# Patient Record
Sex: Female | Born: 1960 | Race: Black or African American | Hispanic: No | Marital: Single | Smoking: Never smoker
Health system: Southern US, Community
[De-identification: ages and names within clinical notes are randomized; demographics above are authoritative.]

## PROBLEM LIST (undated history)

## (undated) DIAGNOSIS — I10 Essential (primary) hypertension: Secondary | ICD-10-CM

## (undated) HISTORY — PX: PARTIAL HYSTERECTOMY: SHX80

---

## 2001-06-23 ENCOUNTER — Encounter: Payer: Self-pay | Admitting: Family Medicine

## 2001-06-23 ENCOUNTER — Ambulatory Visit (HOSPITAL_COMMUNITY): Admission: RE | Admit: 2001-06-23 | Discharge: 2001-06-23 | Payer: Self-pay | Admitting: Family Medicine

## 2002-07-13 ENCOUNTER — Ambulatory Visit (HOSPITAL_COMMUNITY): Admission: RE | Admit: 2002-07-13 | Discharge: 2002-07-13 | Payer: Self-pay | Admitting: Family Medicine

## 2004-08-31 ENCOUNTER — Ambulatory Visit (HOSPITAL_COMMUNITY): Admission: RE | Admit: 2004-08-31 | Discharge: 2004-08-31 | Payer: Self-pay | Admitting: Family Medicine

## 2005-10-29 ENCOUNTER — Ambulatory Visit (HOSPITAL_COMMUNITY): Admission: RE | Admit: 2005-10-29 | Discharge: 2005-10-29 | Payer: Self-pay | Admitting: Family Medicine

## 2006-07-25 ENCOUNTER — Ambulatory Visit (HOSPITAL_COMMUNITY): Admission: RE | Admit: 2006-07-25 | Discharge: 2006-07-25 | Payer: Self-pay | Admitting: Obstetrics and Gynecology

## 2006-11-03 ENCOUNTER — Ambulatory Visit (HOSPITAL_COMMUNITY): Admission: RE | Admit: 2006-11-03 | Discharge: 2006-11-03 | Payer: Self-pay | Admitting: Family Medicine

## 2008-04-27 ENCOUNTER — Ambulatory Visit (HOSPITAL_COMMUNITY): Admission: RE | Admit: 2008-04-27 | Discharge: 2008-04-27 | Payer: Self-pay | Admitting: Family Medicine

## 2009-09-12 ENCOUNTER — Inpatient Hospital Stay (HOSPITAL_COMMUNITY): Admission: RE | Admit: 2009-09-12 | Discharge: 2009-09-14 | Payer: Self-pay | Admitting: Obstetrics and Gynecology

## 2009-09-12 ENCOUNTER — Encounter: Payer: Self-pay | Admitting: Obstetrics and Gynecology

## 2010-06-10 LAB — COMPREHENSIVE METABOLIC PANEL
ALT: 21 U/L (ref 0–35)
BUN: 7 mg/dL (ref 6–23)
CO2: 31 mEq/L (ref 19–32)
Calcium: 9.1 mg/dL (ref 8.4–10.5)
Creatinine, Ser: 0.63 mg/dL (ref 0.4–1.2)
GFR calc non Af Amer: >60 mL/min (ref >60)
Glucose, Bld: 83 mg/dL (ref 70–99)
Sodium: 138 mEq/L (ref 135–145)

## 2010-06-10 LAB — CBC
HCT: 39.4 % (ref 36.0–46.0)
Hemoglobin: 11.5 g/dL — ABNORMAL LOW (ref 12.0–15.0)
Hemoglobin: 13.1 g/dL (ref 12.0–15.0)
MCHC: 33.3 g/dL (ref 30.0–36.0)
MCHC: 33.5 g/dL (ref 30.0–36.0)
MCV: 82.5 fL (ref 78.0–100.0)
RBC: 4.78 MIL/uL (ref 3.87–5.11)
RDW: 14.4 % (ref 11.5–15.5)

## 2010-06-10 LAB — HCG, SERUM, QUALITATIVE: Preg, Serum: NEGATIVE

## 2010-06-10 LAB — TYPE AND SCREEN

## 2011-07-18 ENCOUNTER — Ambulatory Visit (INDEPENDENT_AMBULATORY_CARE_PROVIDER_SITE_OTHER): Payer: BC Managed Care – PPO | Admitting: Otolaryngology

## 2011-07-18 DIAGNOSIS — H903 Sensorineural hearing loss, bilateral: Secondary | ICD-10-CM

## 2011-07-18 DIAGNOSIS — H6120 Impacted cerumen, unspecified ear: Secondary | ICD-10-CM

## 2011-07-18 DIAGNOSIS — H6980 Other specified disorders of Eustachian tube, unspecified ear: Secondary | ICD-10-CM

## 2011-07-18 DIAGNOSIS — H9319 Tinnitus, unspecified ear: Secondary | ICD-10-CM

## 2012-11-02 ENCOUNTER — Other Ambulatory Visit (HOSPITAL_COMMUNITY): Payer: Self-pay | Admitting: Physician Assistant

## 2012-11-02 DIAGNOSIS — Z139 Encounter for screening, unspecified: Secondary | ICD-10-CM

## 2012-11-05 ENCOUNTER — Ambulatory Visit (HOSPITAL_COMMUNITY)
Admission: RE | Admit: 2012-11-05 | Discharge: 2012-11-05 | Disposition: A | Discharge status: Home / Self Care | Payer: BC Managed Care – PPO | Source: Ambulatory Visit | Attending: Physician Assistant | Admitting: Physician Assistant

## 2012-11-05 DIAGNOSIS — Z139 Encounter for screening, unspecified: Secondary | ICD-10-CM

## 2012-11-05 DIAGNOSIS — Z1231 Encounter for screening mammogram for malignant neoplasm of breast: Secondary | ICD-10-CM | POA: Insufficient documentation

## 2014-02-02 ENCOUNTER — Other Ambulatory Visit (HOSPITAL_COMMUNITY): Payer: Self-pay | Admitting: Physician Assistant

## 2014-02-02 DIAGNOSIS — Z139 Encounter for screening, unspecified: Secondary | ICD-10-CM

## 2014-02-07 ENCOUNTER — Ambulatory Visit (HOSPITAL_COMMUNITY)
Admission: RE | Admit: 2014-02-07 | Discharge: 2014-02-07 | Disposition: A | Discharge status: Home / Self Care | Payer: BC Managed Care – PPO | Source: Ambulatory Visit | Attending: Physician Assistant | Admitting: Physician Assistant

## 2014-02-07 DIAGNOSIS — Z139 Encounter for screening, unspecified: Secondary | ICD-10-CM

## 2014-02-07 DIAGNOSIS — Z1231 Encounter for screening mammogram for malignant neoplasm of breast: Secondary | ICD-10-CM | POA: Insufficient documentation

## 2016-07-15 ENCOUNTER — Encounter: Payer: Self-pay | Admitting: Obstetrics and Gynecology

## 2016-07-15 ENCOUNTER — Other Ambulatory Visit (HOSPITAL_COMMUNITY)
Admission: RE | Admit: 2016-07-15 | Discharge: 2016-07-15 | Disposition: A | Discharge status: Home / Self Care | Payer: BC Managed Care – PPO | Source: Ambulatory Visit | Attending: Obstetrics and Gynecology | Admitting: Obstetrics and Gynecology

## 2016-07-15 ENCOUNTER — Ambulatory Visit (INDEPENDENT_AMBULATORY_CARE_PROVIDER_SITE_OTHER): Payer: BC Managed Care – PPO | Admitting: Obstetrics and Gynecology

## 2016-07-15 VITALS — BP 102/60 | HR 76 | Ht 59.0 in | Wt 127.6 lb

## 2016-07-15 DIAGNOSIS — Z124 Encounter for screening for malignant neoplasm of cervix: Secondary | ICD-10-CM | POA: Diagnosis present

## 2016-07-15 DIAGNOSIS — Z01419 Encounter for gynecological examination (general) (routine) without abnormal findings: Secondary | ICD-10-CM

## 2016-07-15 NOTE — Progress Notes (Signed)
Patient ID: Phyllis Swanson, female   DOB: 15-Mar-1961, 56 y.o.   MRN: [id number]  Assessment:  Annual Gyn Exam Vasomotor symptoms    Plan:  1. pap smear done, next pap due in 3 years  2. return annually or prn 3    Annual mammogram and self exams advised 4   Regular colonoscopies advised  5   Rx vivelle dot   Subjective:   Chief Complaint  Patient presents with  . Gynecologic Exam    pap only     Phyllis Swanson is a 56 y.o. female No obstetric history on file. who presents for annual exam. No LMP recorded. Patient has had a hysterectomy. The patient has no complaints today. She reports occasional hot sweats. Per pt, she has previously tried estrogen patches.   Discussion: 1. Discussed with pt current information, studies and literature available on risks and benefits of hormone therapy, specifically in regards to estrogen.   At end of discussion, pt had opportunity to ask questions and has no further questions at this time.   Specific discussion of hormone therapy as noted above. Greater than 50% was spent in counseling and coordination of care with the patient.   Total time greater than: 15 minutes.     The following portions of the patient's history were reviewed and updated as appropriate: allergies, current medications, past family history, past medical history, past social history, past surgical history and problem list. History reviewed. No pertinent past medical history.  Past Surgical History:  Procedure Laterality Date  . PARTIAL HYSTERECTOMY      No current outpatient prescriptions on file.  Review of Systems Constitutional: negative Gastrointestinal: negative Genitourinary: negative   Objective:  BP 102/60   Pulse 76   Ht 4\' 11"  (1.499 m)   Wt 127 lb 9.6 oz (57.9 kg)   BMI 25.77 kg/m    BMI: Body mass index is 25.77 kg/m.  General Appearance: Alert, appropriate appearance for age. No acute distress HEENT: Grossly normal Neck / Thyroid:   Cardiovascular: RRR; normal S1, S2, no murmur Lungs: CTA bilaterally Back: No CVAT Breast Exam: deferred  Gastrointestinal: Soft, non-tender, no masses or organomegaly Pelvic Exam:  External genitalia: normal general appearance Vaginal: normal mucosa without prolapse or lesions Cervix: normal appearance, standard pap obtained  Adnexa/bimanual: normal exam  Uterus surgically removed  Rectovaginal: Normal rectal; no masses, guaiac negative stool obtained.  Lymphatic Exam: Non-palpable nodes in neck, clavicular, axillary, or inguinal regions  Skin: no rash or abnormalities Neurologic: Normal gait and speech, no tremor  Psychiatric: Alert and oriented, appropriate affect.  Urinalysis:Not done  Guaiac negative.   Mallory Shirk. MD Pgr [phone] 4:25 PM   By signing my name below, I, Hansel Feinstein, attest that this documentation has been prepared under the direction and in the presence of Jonnie Kind, MD. Electronically Signed: Hansel Feinstein, ED Scribe. 07/15/16. 4:25 PM.  I personally performed the services described in this documentation, which was SCRIBED in my presence. The recorded information has been reviewed and considered accurate. It has been edited as necessary during review. Jonnie Kind, MD

## 2016-07-17 LAB — CYTOLOGY - PAP
Adequacy: ABSENT
Diagnosis: NEGATIVE
HPV: NOT DETECTED

## 2016-09-26 ENCOUNTER — Other Ambulatory Visit (HOSPITAL_COMMUNITY): Payer: Self-pay | Admitting: Family Medicine

## 2016-09-26 DIAGNOSIS — Z1231 Encounter for screening mammogram for malignant neoplasm of breast: Secondary | ICD-10-CM

## 2016-10-02 ENCOUNTER — Ambulatory Visit (HOSPITAL_COMMUNITY)
Admission: RE | Admit: 2016-10-02 | Discharge: 2016-10-02 | Disposition: A | Discharge status: Home / Self Care | Payer: BC Managed Care – PPO | Source: Ambulatory Visit | Attending: Family Medicine | Admitting: Family Medicine

## 2016-10-02 DIAGNOSIS — Z1231 Encounter for screening mammogram for malignant neoplasm of breast: Secondary | ICD-10-CM | POA: Insufficient documentation

## 2017-12-22 ENCOUNTER — Ambulatory Visit (HOSPITAL_COMMUNITY)
Admission: RE | Admit: 2017-12-22 | Discharge: 2017-12-22 | Disposition: A | Discharge status: Home / Self Care | Payer: BC Managed Care – PPO | Source: Ambulatory Visit | Attending: Family Medicine | Admitting: Family Medicine

## 2017-12-22 ENCOUNTER — Other Ambulatory Visit (HOSPITAL_COMMUNITY): Payer: Self-pay | Admitting: Family Medicine

## 2017-12-22 DIAGNOSIS — Z1231 Encounter for screening mammogram for malignant neoplasm of breast: Secondary | ICD-10-CM | POA: Insufficient documentation

## 2017-12-22 DIAGNOSIS — M151 Heberden's nodes (with arthropathy): Secondary | ICD-10-CM | POA: Diagnosis not present

## 2018-01-26 ENCOUNTER — Ambulatory Visit (INDEPENDENT_AMBULATORY_CARE_PROVIDER_SITE_OTHER): Payer: Self-pay

## 2018-01-26 ENCOUNTER — Encounter: Payer: Self-pay | Admitting: Gastroenterology

## 2018-01-26 DIAGNOSIS — Z1211 Encounter for screening for malignant neoplasm of colon: Secondary | ICD-10-CM

## 2018-01-26 MED ORDER — NA SULFATE-K SULFATE-MG SULF 17.5-3.13-1.6 GM/177ML PO SOLN: 1.0000 | ORAL | 0 refills | Status: DC

## 2018-01-26 NOTE — Progress Notes (Signed)
Gastroenterology Pre-Procedure Review  Request Date:01/26/18 Requesting Physician: Consepcion Hearing [facility] PA- no previous tcs  PATIENT REVIEW QUESTIONS: The patient responded to the following health history questions as indicated:    1. Diabetes Melitis: no 2. Joint replacements in the past 12 months: no 3. Major health problems in the past 3 months: no 4. Has an artificial valve or MVP: no 5. Has a defibrillator: no 6. Has been advised in past to take antibiotics in advance of a procedure like teeth cleaning: no 7. Family history of colon cancer: no  8. Alcohol Use: no 9. History of sleep apnea: no  10. History of coronary artery or other vascular stents placed within the last 12 months: no 11. History of any prior anesthesia complications: no    MEDICATIONS & ALLERGIES:    Patient reports the following regarding taking any blood thinners:   Plavix? no Aspirin? no Coumadin? no Brilinta? no Xarelto? no Eliquis? no Pradaxa? no Savaysa? no Effient? no  Patient confirms/reports the following medications:  No current outpatient medications on file.   No current facility-administered medications for this visit.     Patient confirms/reports the following allergies:  Allergies  Allergen Reactions  . Benadryl [Diphenhydramine Hcl (Sleep)] Other (See Comments)    numbeness    No orders of the defined types were placed in this encounter.   AUTHORIZATION INFORMATION Primary Insurance: [employer]BS [employer] employees,  ID #: [id number] Pre-Cert / Auth required: no  SCHEDULE INFORMATION: Procedure has been scheduled as follows:  Date: 03/09/18, Time: 2:15 Location: [facility] [address]  This Gastroenterology Pre-Precedure Review Form is being routed to the following provider(s): Neil Crouch, PA

## 2018-01-26 NOTE — Patient Instructions (Signed)
Phyllis Swanson  13-Dec-1960 MRN: [id number]     Procedure Date: 03/09/18 Time to register: 1:15pm Place to register: [facility] Procedure Time: 2:15pm Scheduled provider: Barney Drain, MD    PREPARATION FOR COLONOSCOPY WITH SUPREP BOWEL PREP KIT  Note: Suprep Bowel Prep Kit is a split-dose (2day) regimen. Consumption of BOTH 6-ounce bottles is required for a complete prep.  Please notify us immediately if you are diabetic, take iron supplements, or if you are on Coumadin or any other blood thinners.                                                                                                                                 1 DAY BEFORE PROCEDURE:  DATE: 03/08/18   DAY: Sunday  clear liquids the entire day - NO SOLID FOOD.   At 6:00pm: Complete steps 1 through 4 below, using ONE (1) 6-ounce bottle, before going to bed. Step 1:  Pour ONE (1) 6-ounce bottle of SUPREP liquid into the mixing container.  Step 2:  Add cool drinking water to the 16 ounce line on the container and mix.  Note: Dilute the solution concentrate as directed prior to use. Step 3:  DRINK ALL the liquid in the container. Step 4:  You MUST drink an additional two (2) or more 16 ounce containers of water over the next one (1) hour.   Continue clear liquids.  DAY OF PROCEDURE:   DATE: 03/09/18  DAY: Monday If you take medications for your heart, blood pressure, or breathing, you may take these medications.   5 hours before your procedure at :9:15am Step 1:  Pour ONE (1) 6-ounce bottle of SUPREP liquid into the mixing container.  Step 2:  Add cool drinking water to the 16 ounce line on the container and mix.  Note: Dilute the solution concentrate as directed prior to use. Step 3:  DRINK ALL the liquid in the container. Step 4:  You MUST drink an additional two (2) or more 16 ounce containers of water over the next one (1) hour. You MUST complete the final glass of water at least 3 hours before your  colonoscopy.   Nothing by mouth past: 11:15am  You may take your morning medications with sip of water unless we have instructed otherwise.    Please see below for Dietary Information.  CLEAR LIQUIDS INCLUDE:  Water Jello (NOT red in color)   Ice Popsicles (NOT red in color)   Tea (sugar ok, no milk/cream) Powdered fruit flavored drinks  Coffee (sugar ok, no milk/cream) Gatorade/ Lemonade/ Kool-Aid  (NOT red in color)   Juice: apple, white grape, white cranberry Soft drinks  Clear bullion, consomme, broth (fat free beef/chicken/vegetable)  Carbonated beverages (any kind)  Strained chicken noodle soup Hard Candy   Remember: Clear liquids are liquids that will allow you to see your fingers on the other side of a clear glass. Be sure liquids  are NOT red in color, and not cloudy, but CLEAR.  DO NOT EAT OR DRINK ANY OF THE FOLLOWING:  Dairy products of any kind   Cranberry juice Tomato juice / V8 juice   Grapefruit juice Orange juice     Red grape juice  Do not eat any solid foods, including such foods as: cereal, oatmeal, yogurt, fruits, vegetables, creamed soups, eggs, bread, crackers, pureed foods in a blender, etc.   HELPFUL HINTS FOR DRINKING PREP SOLUTION:   Make sure prep is extremely cold. Mix and refrigerate the the morning of the prep. You may also put in the freezer.   You may try mixing some Crystal Light or Country Time Lemonade if you prefer. Mix in small amounts; add more if necessary.  Try drinking through a straw  Rinse mouth with water or a mouthwash between glasses, to remove after-taste.  Try sipping on a cold beverage /ice/ popsicles between glasses of prep.  Place a piece of sugar-free hard candy in mouth between glasses.  If you become nauseated, try consuming smaller amounts, or stretch out the time between glasses. Stop for 30-60 minutes, then slowly start back drinking.     OTHER INSTRUCTIONS  You will need a responsible adult at least 58 years of  age to accompany you and drive you home. This person must remain in the waiting room during your procedure. The hospital will cancel your procedure if you do not have a responsible adult with you.   1. Wear loose fitting clothing that is easily removed. 2. Leave jewelry and other valuables at home.  3. Remove all body piercing jewelry and leave at home. 4. Total time from sign-in until discharge is approximately 2-3 hours. 5. You should go home directly after your procedure and rest. You can resume normal activities the day after your procedure. 6. The day of your procedure you should not:  Drive  Make legal decisions  Operate machinery  Drink alcohol  Return to work   You may call the office (Dept: [phone]) before 5:00pm, or page the doctor on call [phone]) after 5:00pm, for further instructions, if necessary.   Insurance Information YOU WILL NEED TO CHECK WITH YOUR INSURANCE COMPANY FOR THE BENEFITS OF COVERAGE YOU HAVE FOR THIS PROCEDURE.  UNFORTUNATELY, NOT ALL INSURANCE COMPANIES HAVE BENEFITS TO COVER ALL OR PART OF THESE TYPES OF PROCEDURES.  IT IS YOUR RESPONSIBILITY TO CHECK YOUR BENEFITS, HOWEVER, WE WILL BE GLAD TO ASSIST YOU WITH ANY CODES YOUR INSURANCE COMPANY MAY NEED.    PLEASE NOTE THAT MOST INSURANCE COMPANIES WILL NOT COVER A SCREENING COLONOSCOPY FOR PEOPLE UNDER THE AGE OF 50  IF YOU HAVE BCBS INSURANCE, YOU MAY HAVE BENEFITS FOR A SCREENING COLONOSCOPY BUT IF POLYPS ARE FOUND THE DIAGNOSIS WILL CHANGE AND THEN YOU MAY HAVE A DEDUCTIBLE THAT WILL NEED TO BE MET. SO PLEASE MAKE SURE YOU CHECK YOUR BENEFITS FOR A SCREENING COLONOSCOPY AS WELL AS A DIAGNOSTIC COLONOSCOPY.

## 2018-01-28 NOTE — Progress Notes (Signed)
Ok to schedule.

## 2018-03-09 ENCOUNTER — Other Ambulatory Visit: Payer: Self-pay

## 2018-03-09 ENCOUNTER — Encounter (HOSPITAL_COMMUNITY): Payer: Self-pay | Admitting: Gastroenterology

## 2018-03-09 ENCOUNTER — Ambulatory Visit (HOSPITAL_COMMUNITY)
Admission: RE | Admit: 2018-03-09 | Discharge: 2018-03-09 | Disposition: A | Discharge status: Home / Self Care | Payer: BC Managed Care – PPO | Attending: Gastroenterology | Admitting: Gastroenterology

## 2018-03-09 ENCOUNTER — Encounter (HOSPITAL_COMMUNITY)
Admission: RE | Disposition: A | Discharge status: Home / Self Care | Payer: Self-pay | Source: Home / Self Care | Attending: Gastroenterology

## 2018-03-09 DIAGNOSIS — Q438 Other specified congenital malformations of intestine: Secondary | ICD-10-CM | POA: Insufficient documentation

## 2018-03-09 DIAGNOSIS — K648 Other hemorrhoids: Secondary | ICD-10-CM | POA: Diagnosis not present

## 2018-03-09 DIAGNOSIS — Z1211 Encounter for screening for malignant neoplasm of colon: Secondary | ICD-10-CM | POA: Diagnosis not present

## 2018-03-09 DIAGNOSIS — D123 Benign neoplasm of transverse colon: Secondary | ICD-10-CM | POA: Insufficient documentation

## 2018-03-09 DIAGNOSIS — Z888 Allergy status to other drugs, medicaments and biological substances status: Secondary | ICD-10-CM | POA: Insufficient documentation

## 2018-03-09 HISTORY — PX: COLONOSCOPY: SHX5424

## 2018-03-09 HISTORY — PX: POLYPECTOMY: SHX5525

## 2018-03-09 SURGERY — COLONOSCOPY
Anesthesia: Moderate Sedation

## 2018-03-09 MED ORDER — MIDAZOLAM HCL 5 MG/5ML IJ SOLN
INTRAMUSCULAR | Status: AC
  Filled 2018-03-09: qty 10

## 2018-03-09 MED ORDER — MEPERIDINE HCL 100 MG/ML IJ SOLN
INTRAMUSCULAR | Status: DC | PRN
  Administered 2018-03-09 (×2): 25 mg via INTRAVENOUS

## 2018-03-09 MED ORDER — MEPERIDINE HCL 100 MG/ML IJ SOLN
INTRAMUSCULAR | Status: AC
  Filled 2018-03-09: qty 2

## 2018-03-09 MED ORDER — MIDAZOLAM HCL 5 MG/5ML IJ SOLN
INTRAMUSCULAR | Status: DC | PRN
  Administered 2018-03-09 (×2): 2 mg via INTRAVENOUS

## 2018-03-09 MED ORDER — SODIUM CHLORIDE 0.9 % IV SOLN
INTRAVENOUS | Status: DC
  Administered 2018-03-09: 1000 mL via INTRAVENOUS

## 2018-03-09 MED ORDER — STERILE WATER FOR IRRIGATION IR SOLN
Status: DC | PRN
  Administered 2018-03-09: 100 mL

## 2018-03-09 NOTE — Op Note (Signed)
[facility] Patient Name: Phyllis Swanson Procedure Date: 03/09/2018 1:26 PM MRN: [id number] Date of Birth: 03/20/1961 Attending MD: Barney Drain MD, MD CSN: [id number] Age: 57 Admit Type: Outpatient Procedure:                Colonoscopy WITH COLD SNARE POLYPECTOMY Indications:              Screening for colorectal malignant neoplasm Providers:                Barney Drain MD, MD, Rosina Lowenstein, RN, Aram Candela Referring MD:             Collene Mares PA, PA Medicines:                Meperidine 50 mg IV, Midazolam 4 mg IV Complications:            No immediate complications. Estimated Blood Loss:     Estimated blood loss was minimal. Procedure:                Pre-Anesthesia Assessment:                           - Prior to the procedure, a History and Physical                            was performed, and patient medications and                            allergies were reviewed. The patient's tolerance of                            previous anesthesia was also reviewed. The risks                            and benefits of the procedure and the sedation                            options and risks were discussed with the patient.                            All questions were answered, and informed consent                            was obtained. Prior Anticoagulants: The patient has                            taken no previous anticoagulant or antiplatelet                            agents. ASA Grade Assessment: I - A normal, healthy                            patient. After reviewing the risks and benefits,                            the patient was deemed in satisfactory condition to  undergo the procedure. After obtaining informed                            consent, the colonoscope was passed under direct                            vision. Throughout the procedure, the patient's                            blood pressure, pulse, and oxygen saturations  were                            monitored continuously. The PCF-[id number] ([id number])                            scope was introduced through the anus and advanced                            to the the cecum, identified by appendiceal orifice                            and ileocecal valve. The colonoscopy was somewhat                            difficult due to a tortuous colon. Successful                            completion of the procedure was aided by                            straightening and shortening the scope to obtain                            bowel loop reduction and COLOWRAP. The patient                            tolerated the procedure well. The quality of the                            bowel preparation was good. The ileocecal valve,                            appendiceal orifice, and rectum were photographed. Scope In: 2:10:25 PM Scope Out: 2:23:33 PM Scope Withdrawal Time: 0 hours 10 minutes 12 seconds  Total Procedure Duration: 0 hours 13 minutes 8 seconds  Findings:      A 4 mm polyp was found in the proximal transverse colon. The polyp was       sessile. The polyp was removed with a cold snare. Resection and       retrieval were complete.      The recto-sigmoid colon and sigmoid colon were mildly tortuous.      Internal hemorrhoids were found. The hemorrhoids were small. Impression:               - One  4 mm polyp in the proximal transverse colon,                           removed with a cold snare. Resected and retrieved.                           - Tortuous colon.                           - Internal hemorrhoids. Moderate Sedation:      Moderate (conscious) sedation was administered by the endoscopy nurse       and supervised by the endoscopist. The following parameters were       monitored: oxygen saturation, heart rate, blood pressure, and response       to care. Total physician intraservice time was 22 minutes. Recommendation:           - Patient has a contact  number available for                            emergencies. The signs and symptoms of potential                            delayed complications were discussed with the                            patient. Return to normal activities tomorrow.                            Written discharge instructions were provided to the                            patient.                           - High fiber diet.                           - Continue present medications.                           - Await pathology results.                           - Repeat colonoscopy in 5-10 years for surveillance. Procedure Code(s):        --- Professional ---                           [phone], Colonoscopy, flexible; with removal of                            tumor(s), polyp(s), or other lesion(s) by snare                            technique                           G0500,  Moderate sedation services provided by the                            same physician or other qualified health care                            professional performing a gastrointestinal                            endoscopic service that sedation supports,                            requiring the presence of an independent trained                            observer to assist in the monitoring of the                            patient's level of consciousness and physiological                            status; initial 15 minutes of intra-service time;                            patient age 39 years or older (additional time may                            be reported with [phone], as appropriate) Diagnosis Code(s):        --- Professional ---                           Z12.11, Encounter for screening for malignant                            neoplasm of colon                           D12.3, Benign neoplasm of transverse colon (hepatic                            flexure or splenic flexure)                           K64.8, Other hemorrhoids                            Q43.8, Other specified congenital malformations of                            intestine CPT copyright 2018 American Medical Association. All rights reserved. The codes documented in this report are preliminary and upon coder review may  be revised to meet current compliance requirements. Barney Drain, MD Barney Drain MD, MD 03/09/2018 2:42:41 PM This report has been signed electronically. Number of Addenda: 0

## 2018-03-09 NOTE — H&P (Signed)
Primary Care Physician:  Ginger Organ Primary Gastroenterologist:  Dr. Oneida Alar  Pre-Procedure History & Physical: HPI:  Phyllis Swanson is a 57 y.o. female here for [place].  History reviewed. No pertinent past medical history.  Past Surgical History:  Procedure Laterality Date  . CESAREAN SECTION  1987  . PARTIAL HYSTERECTOMY      Prior to Admission medications   Medication Sig Start Date End Date Taking? Authorizing Provider  Multiple Vitamin (MULTIVITAMIN WITH MINERALS) TABS tablet Take 1 tablet by mouth every evening.   Yes [provider]  Na Sulfate-K Sulfate-Mg Sulf (SUPREP BOWEL PREP KIT) 17.5-3.13-1.6 GM/177ML SOLN Take 1 kit by mouth as directed. 01/26/18  Yes Mahala Menghini, PA-C  Polyethyl Glycol-Propyl Glycol (LUBRICANT EYE DROPS) 0.4-0.3 % SOLN Place 1 drop into both eyes 3 (three) times daily as needed (dry/irritated eyes.).   Yes [provider]  acetaminophen (TYLENOL) 325 MG tablet Take 650 mg by mouth every 6 (six) hours as needed (for headaches).    [provider]    Allergies as of 01/26/2018 - Review Complete 01/26/2018  Allergen Reaction Noted  . Benadryl [diphenhydramine hcl (sleep)] Other (See Comments) 07/15/2016    Family History  Problem Relation Age of Onset  . Colon polyps Neg Hx   . Colon cancer Neg Hx     Social History   Socioeconomic History  . Marital status: Single    Spouse name: Not on file  . Number of children: Not on file  . Years of education: Not on file  . Highest education level: Not on file  Occupational History  . Not on file  Social Needs  . Financial resource strain: Not on file  . Food insecurity:    Worry: Not on file    Inability: Not on file  . Transportation needs:    Medical: Not on file    Non-medical: Not on file  Tobacco Use  . Smoking status: Never Smoker  . Smokeless tobacco: Never Used  Substance and Sexual Activity  . Alcohol use: Not Currently   Frequency: Never  . Drug use: Not Currently  . Sexual activity: Yes  Lifestyle  . Physical activity:    Days per week: Not on file    Minutes per session: Not on file  . Stress: Not on file  Relationships  . Social connections:    Talks on phone: Not on file    Gets together: Not on file    Attends religious service: Not on file    Active member of club or organization: Not on file    Attends meetings of clubs or organizations: Not on file    Relationship status: Not on file  . Intimate partner violence:    Fear of current or ex partner: Not on file    Emotionally abused: Not on file    Physically abused: Not on file    Forced sexual activity: Not on file  Other Topics Concern  . Not on file  Social History Narrative  . Not on file    Review of Systems: See HPI, otherwise negative ROS   Physical Exam: BP (!) 150/96   Pulse 88   Temp 97.9 F (36.6 C) (Oral)   Resp 10   Ht '4\' 11"'$  (1.499 m)   Wt 56.7 kg   SpO2 96%   BMI 25.25 kg/m  General:   Alert,  pleasant and cooperative in NAD Head:  Normocephalic and atraumatic. Neck:  Supple; Lungs:  Clear throughout to auscultation.    Heart:  Regular rate and rhythm. Abdomen:  Soft, nontender and nondistended. Normal bowel sounds, without guarding, and without rebound.   Neurologic:  Alert and  oriented x4;  grossly normal neurologically.  Impression/Plan:    SCREENING  Plan:  1. TCS TODAY DISCUSSED PROCEDURE, BENEFITS, & RISKS: < 1% chance of medication reaction, bleeding, perforation, or rupture of spleen/liver.

## 2018-03-09 NOTE — Discharge Instructions (Signed)
You had 1 small polyp removed. You have SMALL internal hemorrhoids.   DRINK WATER TO KEEP YOUR URINE LIGHT YELLOW.  FOLLOW A HIGH FIBER DIET. AVOID ITEMS THAT CAUSE BLOATING & GAS. SEE INFO BELOW.  YOUR BIOPSY RESULTS WILL BE BACK IN 5 BUSINESS DAYS.  Next colonoscopy  BETWEEN 2024-2026.   Colonoscopy Care After Read the instructions outlined below and refer to this sheet in the next week. These discharge instructions provide you with general information on caring for yourself after you leave the hospital. While your treatment has been planned according to the most current medical practices available, unavoidable complications occasionally occur. If you have any problems or questions after discharge, call DR. FIELDS, [phone].  ACTIVITY  You may resume your regular activity, but move at a slower pace for the next 24 hours.   Take frequent rest periods for the next 24 hours.   Walking will help get rid of the air and reduce the bloated feeling in your belly (abdomen).   No driving for 24 hours (because of the medicine (anesthesia) used during the test).   You may shower.   Do not sign any important legal documents or operate any machinery for 24 hours (because of the anesthesia used during the test).    NUTRITION  Drink plenty of fluids.   You may resume your normal diet as instructed by your doctor.   Begin with a light meal and progress to your normal diet. Heavy or fried foods are harder to digest and may make you feel sick to your stomach (nauseated).   Avoid alcoholic beverages for 24 hours or as instructed.    MEDICATIONS  You may resume your normal medications.   WHAT YOU CAN EXPECT TODAY  Some feelings of bloating in the abdomen.   Passage of more gas than usual.   Spotting of blood in your stool or on the toilet paper  .  IF YOU HAD POLYPS REMOVED DURING THE COLONOSCOPY:  Eat a soft diet IF YOU HAVE NAUSEA, BLOATING, ABDOMINAL PAIN, OR  VOMITING.    FINDING OUT THE RESULTS OF YOUR TEST Not all test results are available during your visit. DR. Oneida Alar WILL CALL YOU WITHIN 14 DAYS OF YOUR PROCEDUE WITH YOUR RESULTS. Do not assume everything is normal if you have not heard from DR. FIELDS, CALL HER OFFICE AT [phone].  SEEK IMMEDIATE MEDICAL ATTENTION AND CALL THE OFFICE: [phone] IF:  You have more than a spotting of blood in your stool.   Your belly is swollen (abdominal distention).   You are nauseated or vomiting.   You have a temperature over 101F.   You have abdominal pain or discomfort that is severe or gets worse throughout the day.   High-Fiber Diet A high-fiber diet changes your normal diet to include more whole grains, legumes, fruits, and vegetables. Changes in the diet involve replacing refined carbohydrates with unrefined foods. The calorie level of the diet is essentially unchanged. The Dietary Reference Intake (recommended amount) for adult males is 38 grams per day. For adult females, it is 25 grams per day. Pregnant and lactating women should consume 28 grams of fiber per day. Fiber is the intact part of a plant that is not broken down during digestion. Functional fiber is fiber that has been isolated from the plant to provide a beneficial effect in the body.  PURPOSE  Increase stool bulk.   Ease and regulate bowel movements.   Lower cholesterol.   REDUCE RISK OF COLON  CANCER  INDICATIONS THAT YOU NEED MORE FIBER  Constipation and hemorrhoids.   Uncomplicated diverticulosis (intestine condition) and irritable bowel syndrome.   Weight management.   As a protective measure against hardening of the arteries (atherosclerosis), diabetes, and cancer.   GUIDELINES FOR INCREASING FIBER IN THE DIET  Start adding fiber to the diet slowly. A gradual increase of about 5 more grams (2 slices of whole-wheat bread, 2 servings of most fruits or vegetables, or 1 bowl of high-fiber cereal) per day is  best. Too rapid an increase in fiber may result in constipation, flatulence, and bloating.   Drink enough water and fluids to keep your urine clear or pale yellow. Water, juice, or caffeine-free drinks are recommended. Not drinking enough fluid may cause constipation.   Eat a variety of high-fiber foods rather than one type of fiber.   Try to increase your intake of fiber through using high-fiber foods rather than fiber pills or supplements that contain small amounts of fiber.   The goal is to change the types of food eaten. Do not supplement your present diet with high-fiber foods, but replace foods in your present diet.   INCLUDE A VARIETY OF FIBER SOURCES  Replace refined and processed grains with whole grains, canned fruits with fresh fruits, and incorporate other fiber sources. White rice, white breads, and most bakery goods contain little or no fiber.   Brown whole-grain rice, buckwheat oats, and many fruits and vegetables are all good sources of fiber. These include: broccoli, Brussels sprouts, cabbage, cauliflower, beets, sweet potatoes, white potatoes (skin on), carrots, tomatoes, eggplant, squash, berries, fresh fruits, and dried fruits.   Cereals appear to be the richest source of fiber. Cereal fiber is found in whole grains and bran. Bran is the fiber-rich outer coat of cereal grain, which is largely removed in refining. In whole-grain cereals, the bran remains. In breakfast cereals, the largest amount of fiber is found in those with "bran" in their names. The fiber content is sometimes indicated on the label.   You may need to include additional fruits and vegetables each day.   In baking, for 1 cup white flour, you may use the following substitutions:   1 cup whole-wheat flour minus 2 tablespoons.   1/2 cup white flour plus 1/2 cup whole-wheat flour.   Polyps, Colon  A polyp is extra tissue that grows inside your body. Colon polyps grow in the large intestine. The large  intestine, also called the colon, is part of your digestive system. It is a long, hollow tube at the end of your digestive tract where your body makes and stores stool. Most polyps are not dangerous. They are benign. This means they are not cancerous. But over time, some types of polyps can turn into cancer. Polyps that are smaller than a pea are usually not harmful. But larger polyps could someday become or may already be cancerous. To be safe, doctors remove all polyps and test them.   WHO GETS POLYPS? Anyone can get polyps, but certain people are more likely than others. You may have a greater chance of getting polyps if:  You are over 50.   You have had polyps before.   Someone in your family has had polyps.   Someone in your family has had cancer of the large intestine.   Find out if someone in your family has had polyps. You may also be more likely to get polyps if you:   Eat a lot of fatty foods  Smoke   Drink alcohol   Do not exercise  Eat too much   PREVENTION There is not one sure way to prevent polyps. You might be able to lower your risk of getting them if you:  Eat more fruits and vegetables and less fatty food.   Do not smoke.   Avoid alcohol.   Exercise every day.   Lose weight if you are overweight.   Eating more calcium and folate can also lower your risk of getting polyps. Some foods that are rich in calcium are milk, cheese, and broccoli. Some foods that are rich in folate are chickpeas, kidney beans, and spinach.

## 2018-03-12 ENCOUNTER — Encounter (HOSPITAL_COMMUNITY): Payer: Self-pay | Admitting: Gastroenterology

## 2018-03-19 ENCOUNTER — Telehealth: Payer: Self-pay | Admitting: Gastroenterology

## 2018-03-19 NOTE — Telephone Encounter (Signed)
Please call pt. She had ONE simple adenoma removed.   DRINK WATER TO KEEP YOUR URINE LIGHT YELLOW.  FOLLOW A HIGH FIBER DIET. AVOID ITEMS THAT CAUSE BLOATING & GAS.  Next colonoscopy  BETWEEN 2024-2026.

## 2018-03-20 NOTE — Telephone Encounter (Signed)
Pt notified of results

## 2018-03-23 NOTE — Telephone Encounter (Signed)
ON RECALL  °

## 2019-03-04 ENCOUNTER — Other Ambulatory Visit (HOSPITAL_COMMUNITY): Payer: Self-pay

## 2019-03-04 DIAGNOSIS — Z1231 Encounter for screening mammogram for malignant neoplasm of breast: Secondary | ICD-10-CM

## 2019-03-11 ENCOUNTER — Other Ambulatory Visit: Payer: Self-pay

## 2019-03-11 ENCOUNTER — Ambulatory Visit (HOSPITAL_COMMUNITY)
Admission: RE | Admit: 2019-03-11 | Discharge: 2019-03-11 | Disposition: A | Discharge status: Home / Self Care | Payer: BC Managed Care – PPO | Source: Ambulatory Visit | Attending: Physician Assistant | Admitting: Physician Assistant

## 2019-03-11 DIAGNOSIS — Z1231 Encounter for screening mammogram for malignant neoplasm of breast: Secondary | ICD-10-CM | POA: Diagnosis not present

## 2019-09-22 IMAGING — MG DIGITAL SCREENING BILATERAL MAMMOGRAM WITH TOMO AND CAD
8 series · 9 of 24 positions shown · non-contrast
Comparison: Previous exam(s).

CLINICAL DATA: Screening.

EXAM:
DIGITAL SCREENING BILATERAL MAMMOGRAM WITH TOMO AND CAD

[R CC synth-2D]
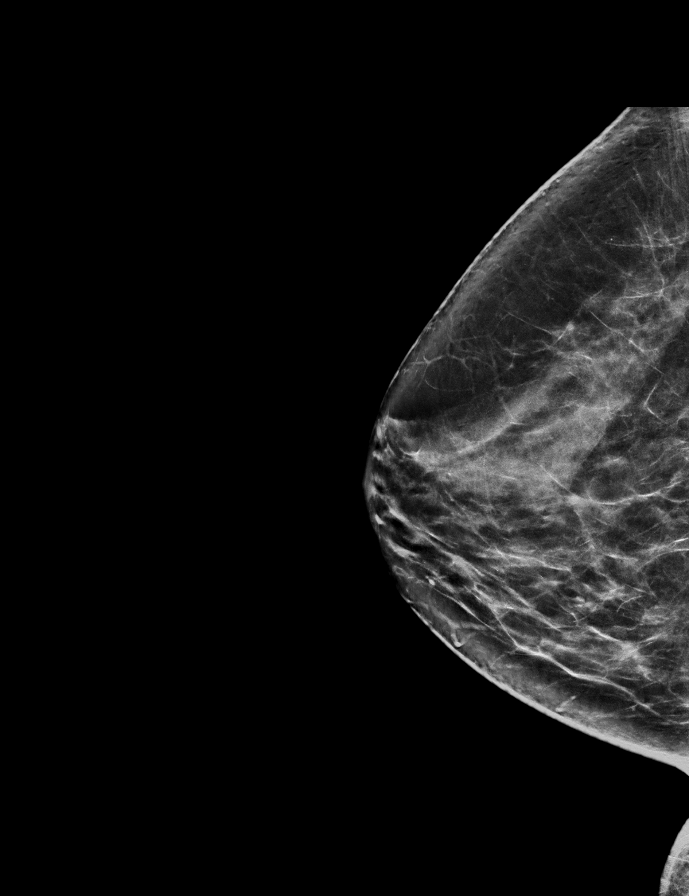

[L MLO synth-2D]
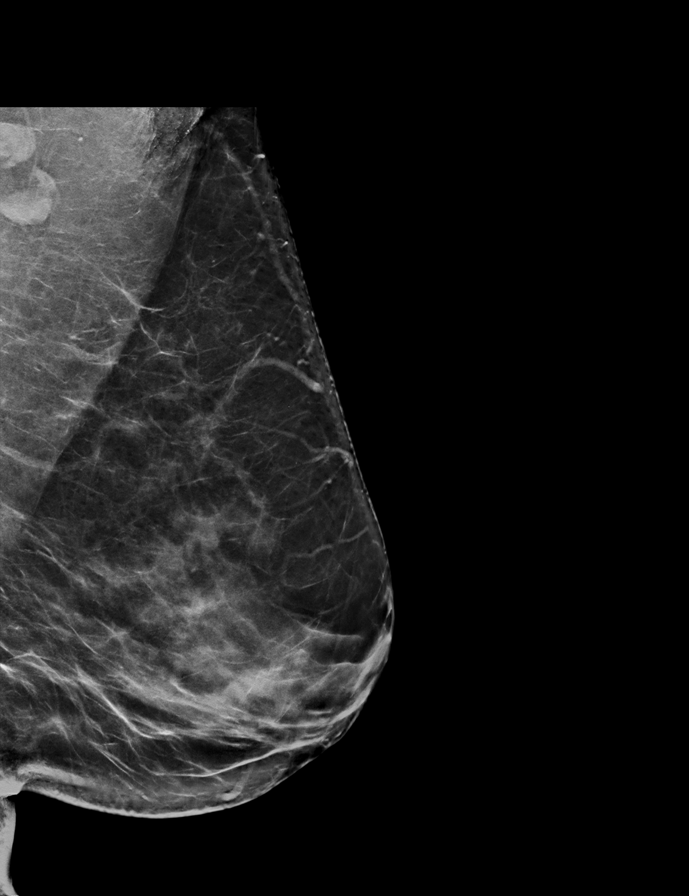

[R MLO synth-2D]
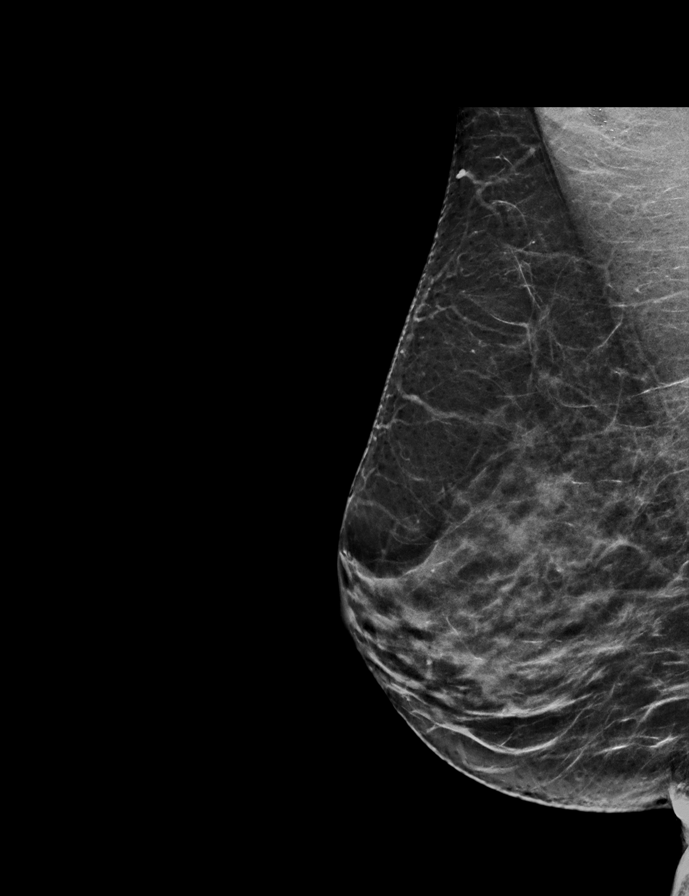

[L CC synth-2D]
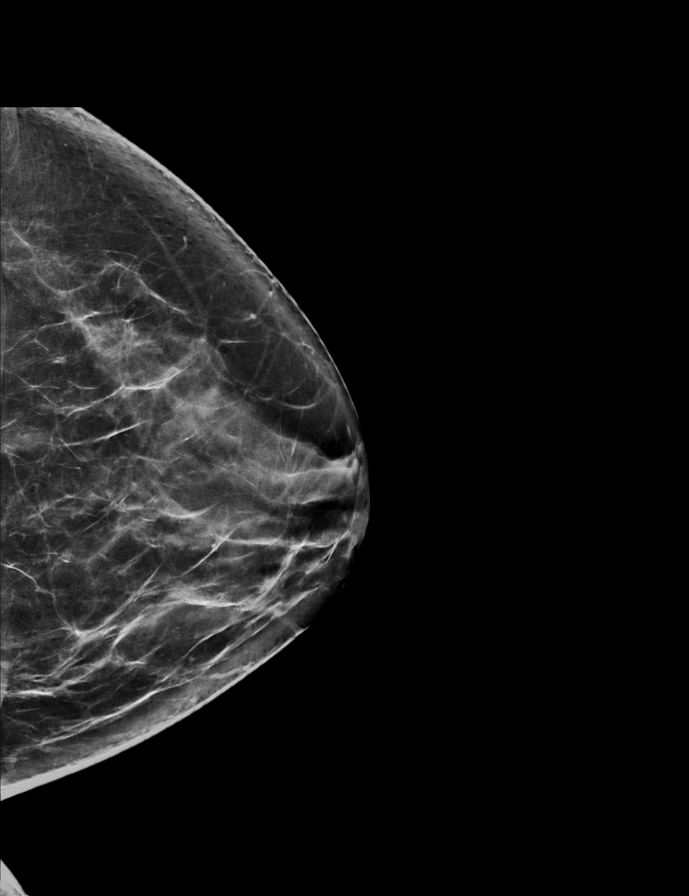

[R MLO tomo · 2 of 64 frames shown]
[frame 21/64]
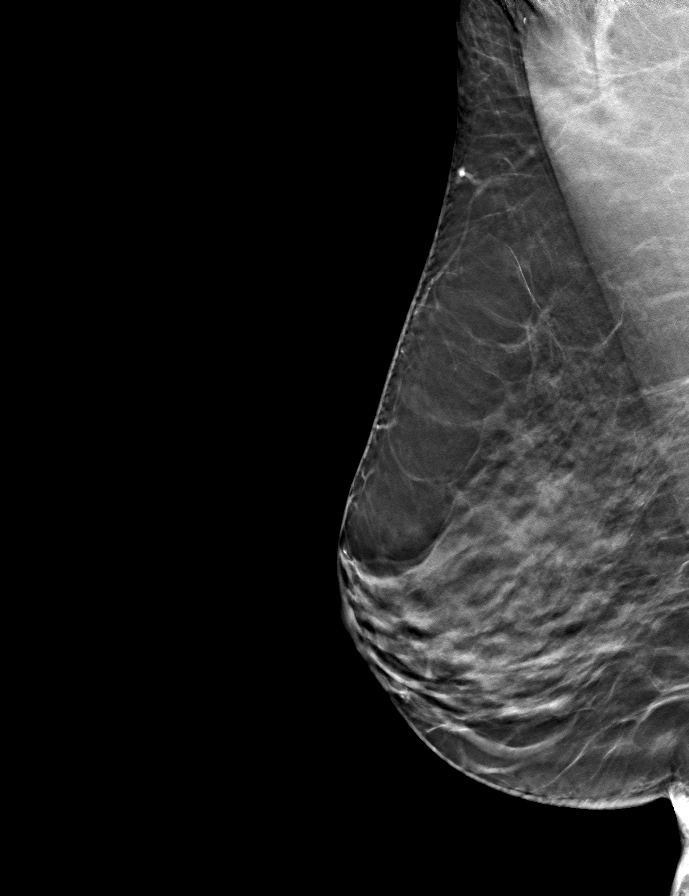
[frame 33/64]
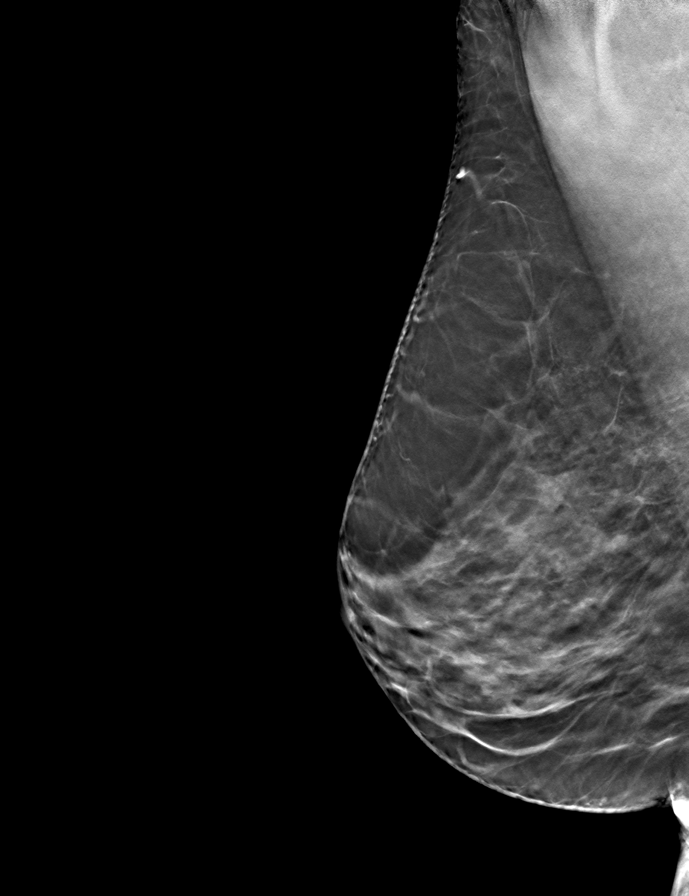

[L CC tomo · tomo slice 37/72.0]
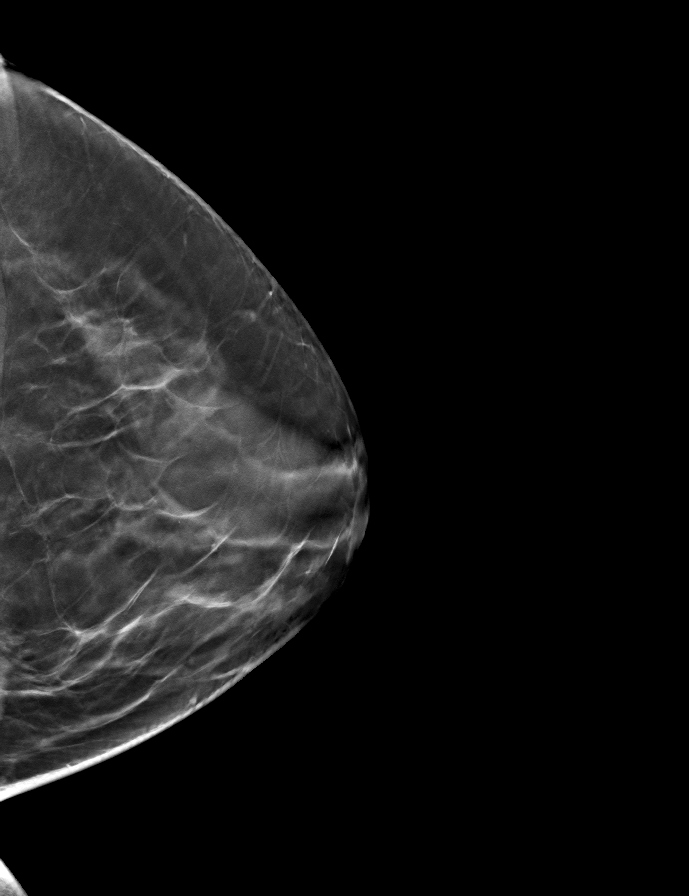

[R CC tomo · tomo slice 35/69.0]
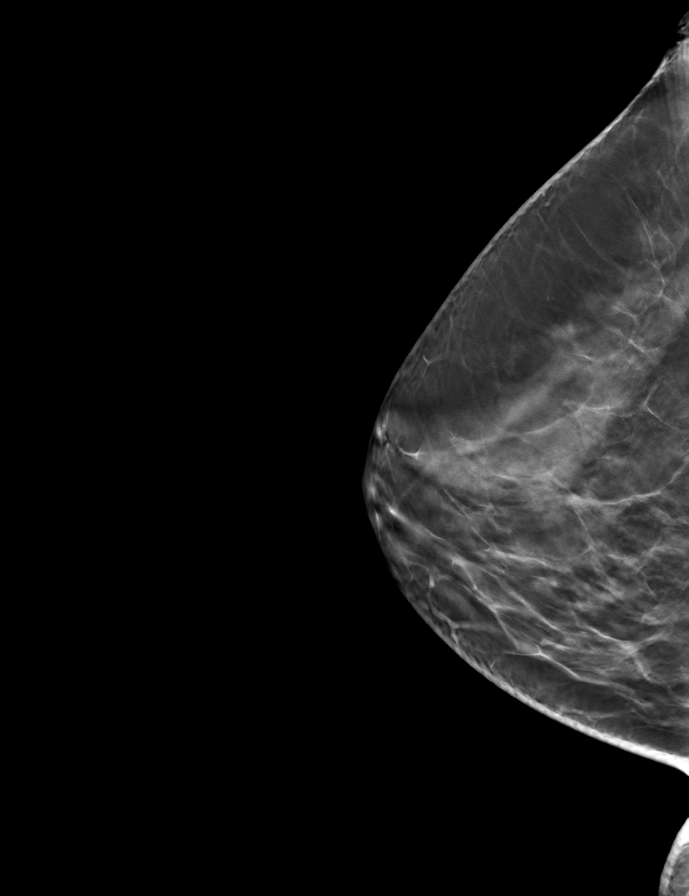

[L MLO tomo · tomo slice 38/75.0]
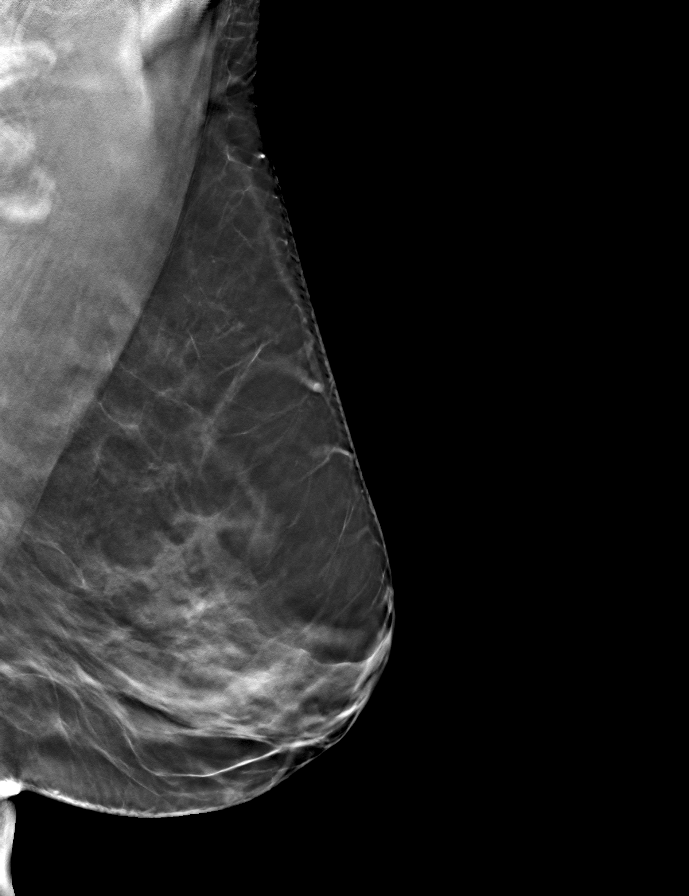

[9 of 24 positions shown; findings below may reference images not displayed]

ACR Breast Density Category c: The breast tissue is heterogeneously
dense, which may obscure small masses.
FINDINGS: There are no findings suspicious for malignancy. Images were
processed with CAD.
IMPRESSION: No mammographic evidence of malignancy. A result letter of this
screening mammogram will be mailed directly to the patient.

RECOMMENDATION:
Screening mammogram in one year. (Code:[id number])

BI-RADS CATEGORY  1: Negative.

## 2020-01-21 ENCOUNTER — Other Ambulatory Visit: Payer: Self-pay

## 2020-01-21 ENCOUNTER — Encounter: Payer: Self-pay | Admitting: Emergency Medicine

## 2020-01-21 ENCOUNTER — Ambulatory Visit
Admission: EM | Admit: 2020-01-21 | Discharge: 2020-01-21 | Disposition: A | Discharge status: Home / Self Care | Payer: BC Managed Care – PPO | Attending: Emergency Medicine | Admitting: Emergency Medicine

## 2020-01-21 DIAGNOSIS — T7840XA Allergy, unspecified, initial encounter: Secondary | ICD-10-CM | POA: Diagnosis not present

## 2020-01-21 DIAGNOSIS — R220 Localized swelling, mass and lump, head: Secondary | ICD-10-CM

## 2020-01-21 MED ORDER — PREDNISONE 20 MG PO TABS: 20.0000 mg | ORAL_TABLET | Freq: Two times a day (BID) | ORAL | 0 refills | Status: AC

## 2020-01-21 MED ORDER — DEXAMETHASONE SODIUM PHOSPHATE 10 MG/ML IJ SOLN
10.0000 mg | Freq: Once | INTRAMUSCULAR | Status: AC
  Administered 2020-01-21: 10 mg via INTRAMUSCULAR

## 2020-01-21 NOTE — ED Triage Notes (Signed)
Mild swelling, tingling, skin peeling from top and bottom lips x 2 days.  Pt states she has not started any medication.  The last thing she ate before it started tomato soup that was spicy.

## 2020-01-21 NOTE — ED Provider Notes (Signed)
[facility]   [id number] 01/21/20 Arrival Time: 8502  Cc: Allergic reaction  SUBJECTIVE:  Phyllis Swanson is a 59 y.o. female who presents with possible allergic reaction that began 2 days ago.  Reports RT ear pain, lips swelling, burning and tingling.  Denies changes in medication or starting a new medication.  Has tried OTC advil without relief.  Denies aggravating factors.  Reports previous symptoms in the past.   Has allergy to benadryl.  Denies fever, chills, nausea, vomiting, swollen glands, oral manifestations such as throat swelling/ tingling, mouth swelling/ tingling, tongue swelling/tingling, dyspnea, SOB, chest pain, abdominal pain, changes in bowel or bladder function.     ROS: As per HPI.  All other pertinent ROS negative.     History reviewed. No pertinent past medical history. Past Surgical History:  Procedure Laterality Date  . CESAREAN SECTION  1987  . COLONOSCOPY N/A 03/09/2018   Procedure: COLONOSCOPY;  Surgeon: Danie Binder, MD;  Location: [facility];  Service: Endoscopy;  Laterality: N/A;  2:15  . PARTIAL HYSTERECTOMY    . POLYPECTOMY  03/09/2018   Procedure: POLYPECTOMY;  Surgeon: Danie Binder, MD;  Location: [facility];  Service: Endoscopy;;  transverse polyp   Allergies  Allergen Reactions  . Benadryl [Diphenhydramine Hcl (Sleep)] Other (See Comments)    numbeness   No current facility-administered medications on file prior to encounter.   Current Outpatient Medications on File Prior to Encounter  Medication Sig Dispense Refill  . acetaminophen (TYLENOL) 325 MG tablet Take 650 mg by mouth every 6 (six) hours as needed (for headaches).    . Multiple Vitamin (MULTIVITAMIN WITH MINERALS) TABS tablet Take 1 tablet by mouth every evening.    Vladimir Faster Glycol-Propyl Glycol (LUBRICANT EYE DROPS) 0.4-0.3 % SOLN Place 1 drop into both eyes 3 (three) times daily as needed (dry/irritated eyes.).      Social History   Socioeconomic History   . Marital status: Single    Spouse name: Not on file  . Number of children: Not on file  . Years of education: Not on file  . Highest education level: Not on file  Occupational History  . Not on file  Tobacco Use  . Smoking status: Never Smoker  . Smokeless tobacco: Never Used  Vaping Use  . Vaping Use: Never used  Substance and Sexual Activity  . Alcohol use: Not Currently  . Drug use: Not Currently  . Sexual activity: Yes  Other Topics Concern  . Not on file  Social History Narrative  . Not on file   Social Determinants of Health   Financial Resource Strain:   . Difficulty of Paying Living Expenses: Not on file  Food Insecurity:   . Worried About Charity fundraiser in the Last Year: Not on file  . Ran Out of Food in the Last Year: Not on file  Transportation Needs:   . Lack of Transportation (Medical): Not on file  . Lack of Transportation (Non-Medical): Not on file  Physical Activity:   . Days of Exercise per Week: Not on file  . Minutes of Exercise per Session: Not on file  Stress:   . Feeling of Stress : Not on file  Social Connections:   . Frequency of Communication with Friends and Family: Not on file  . Frequency of Social Gatherings with Friends and Family: Not on file  . Attends Religious Services: Not on file  . Active Member of Clubs or Organizations: Not on  file  . Attends Archivist Meetings: Not on file  . Marital Status: Not on file  Intimate Partner Violence:   . Fear of Current or Ex-Partner: Not on file  . Emotionally Abused: Not on file  . Physically Abused: Not on file  . Sexually Abused: Not on file   Family History  Problem Relation Age of Onset  . Colon polyps Neg Hx   . Colon cancer Neg Hx      OBJECTIVE:  Vitals:   01/21/20 1106  BP: (!) 141/94  Pulse: 71  Resp: 16  Temp: 98 F (36.7 C)  TempSrc: Oral  SpO2: 98%    General appearance: Alert, speaking in full sentences without difficulty HEENT: NCAT; no obvious  facial swelling; Ears: EACs clear, TMs pearly gray; Eyes: PERRL.  EOM grossly intact. Nose: nares patent without rhinorrhea; Throat: tonsils nonerythematous or enlarged, uvula midline Neck: supple without LAD Lungs: clear to auscultation bilaterally without adventitious breath sounds; normal respiratory effort; no labored respirations Heart: regular rate and rhythm.   Skin: warm and dry Psychological: alert and cooperative; normal mood and affect  ASSESSMENT & PLAN:  1. Allergic reaction, initial encounter   2. Lip swelling     Meds ordered this encounter  Medications  . predniSONE (DELTASONE) 20 MG tablet    Sig: Take 1 tablet (20 mg total) by mouth 2 (two) times daily with a meal for 3 days.    Dispense:  6 tablet    Refill:  0    Order Specific Question:   Supervising Provider    Answer:   Raylene Everts [[id number]]  . dexamethasone (DECADRON) injection 10 mg   Decadron 10 mg shot given in office Rest push fluids Follow up with PCP in 24 hours to be reevaluated and to ensure your symptoms are improving Prednisone 40 mg daily for 3 days prescribed.  Take as directed and to completion. Return or go to the ED if you have any new or worsening symptoms such as difficulty breathing, shortness of breath, chest pain, nausea, vomiting, throat tightness or swelling, tongue swelling or tingling, worsening lip or facial swelling, abdominal pain, changes in bowel or bladder habits, no improvement despite medications, etc...   Reviewed expectations re: course of current medical issues. Questions answered. Outlined signs and symptoms indicating need for more acute intervention. Patient verbalized understanding. After Visit Summary given.          Lestine Box, PA-C 01/21/20 1229

## 2020-01-21 NOTE — Discharge Instructions (Signed)
Decadron 10 mg shot given in office Rest push fluids Follow up with PCP in 24 hours to be reevaluated and to ensure your symptoms are improving Prednisone 40 mg daily for 3 days prescribed.  Take as directed and to completion. Return or go to the ED if you have any new or worsening symptoms such as difficulty breathing, shortness of breath, chest pain, nausea, vomiting, throat tightness or swelling, tongue swelling or tingling, worsening lip or facial swelling, abdominal pain, changes in bowel or bladder habits, no improvement despite medications, etc..Marland Kitchen

## 2020-08-14 ENCOUNTER — Other Ambulatory Visit: Payer: Self-pay

## 2020-08-14 ENCOUNTER — Ambulatory Visit
Admission: EM | Admit: 2020-08-14 | Discharge: 2020-08-14 | Disposition: A | Discharge status: Home / Self Care | Payer: BC Managed Care – PPO | Attending: Family Medicine | Admitting: Family Medicine

## 2020-08-14 DIAGNOSIS — L237 Allergic contact dermatitis due to plants, except food: Secondary | ICD-10-CM | POA: Diagnosis not present

## 2020-08-14 MED ORDER — PREDNISONE 10 MG (21) PO TBPK: ORAL_TABLET | Freq: Every day | ORAL | 0 refills | Status: AC

## 2020-08-14 MED ORDER — DEXAMETHASONE SODIUM PHOSPHATE 10 MG/ML IJ SOLN
10.0000 mg | Freq: Once | INTRAMUSCULAR | Status: AC
  Administered 2020-08-14: 10 mg via INTRAMUSCULAR

## 2020-08-14 MED ORDER — HYDROXYZINE HCL 25 MG PO TABS: 25.0000 mg | ORAL_TABLET | Freq: Four times a day (QID) | ORAL | 0 refills | Status: DC

## 2020-08-14 MED ORDER — TRIAMCINOLONE ACETONIDE 0.1 % EX CREA: 1.0000 "application " | TOPICAL_CREAM | Freq: Two times a day (BID) | CUTANEOUS | 0 refills | Status: DC

## 2020-08-14 NOTE — ED Triage Notes (Signed)
Pt presents with c/o rash  All over, pt believes  to be poison oak, skin has some oozing

## 2020-08-14 NOTE — ED Provider Notes (Signed)
[facility]    CSN: [id number] Arrival date & time: 08/14/20  1046      History   Chief Complaint Chief Complaint  Patient presents with  . Rash    HPI Phyllis Swanson is a 60 y.o. female.   Reports rash from poison oak for the last 5 days.  Calamine lotion at home.  States that the rash is the worst at her inner elbows bilaterally.  Reports that she is taking Benadryl for the itching with little relief.  States that she gets poison oak every year and that this is not new to her.  Denies new recent exposures.  Denies headache, cough, abdominal pain, nausea, vomiting, shortness of breath, diarrhea, fever, other symptoms.  ROS per HPI  The history is provided by the patient.  Rash   History reviewed. No pertinent past medical history.  Patient Active Problem List   Diagnosis Date Noted  . Special screening for malignant neoplasms, colon     Past Surgical History:  Procedure Laterality Date  . CESAREAN SECTION  1987  . COLONOSCOPY N/A 03/09/2018   Procedure: COLONOSCOPY;  Surgeon: Danie Binder, MD;  Location: [facility];  Service: Endoscopy;  Laterality: N/A;  2:15  . PARTIAL HYSTERECTOMY    . POLYPECTOMY  03/09/2018   Procedure: POLYPECTOMY;  Surgeon: Danie Binder, MD;  Location: [facility];  Service: Endoscopy;;  transverse polyp    OB History    Gravida  1   Para      Term      Preterm      AB      Living  1     SAB      IAB      Ectopic      Multiple      Live Births  1            Home Medications    Prior to Admission medications   Medication Sig Start Date End Date Taking? Authorizing Provider  hydrOXYzine (ATARAX/VISTARIL) 25 MG tablet Take 1 tablet (25 mg total) by mouth every 6 (six) hours. 08/14/20  Yes Faustino Congress, NP  predniSONE (STERAPRED UNI-PAK 21 TAB) 10 MG (21) TBPK tablet Take by mouth daily for 6 days. Take 6 tablets on day 1, 5 tablets on day 2, 4 tablets on day 3, 3 tablets on day 4, 2  tablets on day 5, 1 tablet on day 6 08/14/20 08/20/20 Yes Faustino Congress, NP  triamcinolone cream (KENALOG) 0.1 % Apply 1 application topically 2 (two) times daily. 08/14/20  Yes Faustino Congress, NP  acetaminophen (TYLENOL) 325 MG tablet Take 650 mg by mouth every 6 (six) hours as needed (for headaches).    [provider]  Multiple Vitamin (MULTIVITAMIN WITH MINERALS) TABS tablet Take 1 tablet by mouth every evening.    [provider]  Polyethyl Glycol-Propyl Glycol (LUBRICANT EYE DROPS) 0.4-0.3 % SOLN Place 1 drop into both eyes 3 (three) times daily as needed (dry/irritated eyes.).    [provider]    Family History Family History  Problem Relation Age of Onset  . Colon polyps Neg Hx   . Colon cancer Neg Hx     Social History Social History   Tobacco Use  . Smoking status: Never Smoker  . Smokeless tobacco: Never Used  Vaping Use  . Vaping Use: Never used  Substance Use Topics  . Alcohol use: Not Currently  . Drug use: Not Currently  Allergies   Benadryl [diphenhydramine hcl (sleep)]   Review of Systems Review of Systems  Skin: Positive for rash.     Physical Exam Triage Vital Signs ED Triage Vitals  Enc Vitals Group     BP 08/14/20 1340 (!) 135/93     Pulse Rate 08/14/20 1340 68     Resp 08/14/20 1340 18     Temp 08/14/20 1340 97.7 F (36.5 C)     Temp src --      SpO2 08/14/20 1340 98 %     Weight --      Height --      Head Circumference --      Peak Flow --      Pain Score 08/14/20 1338 0     Pain Loc --      Pain Edu? --      Excl. in [place]? --    No data found.  Updated Vital Signs BP (!) 135/93   Pulse 68   Temp 97.7 F (36.5 C)   Resp 18   SpO2 98%   Visual Acuity Right Eye Distance:   Left Eye Distance:   Bilateral Distance:    Right Eye Near:   Left Eye Near:    Bilateral Near:     Physical Exam Vitals and nursing note reviewed.  Constitutional:      General: She is not in acute  distress.    Appearance: Normal appearance. She is well-developed. She is not ill-appearing.  HENT:     Head: Normocephalic and atraumatic.     Nose: Nose normal.     Mouth/Throat:     Mouth: Mucous membranes are moist.     Pharynx: Oropharynx is clear.  Eyes:     Conjunctiva/sclera: Conjunctivae normal.     Pupils: Pupils are equal, round, and reactive to light.  Cardiovascular:     Rate and Rhythm: Normal rate and regular rhythm.  Pulmonary:     Effort: Pulmonary effort is normal.  Musculoskeletal:        General: Normal range of motion.     Cervical back: Normal range of motion and neck supple.  Skin:    General: Skin is warm and dry.     Capillary Refill: Capillary refill takes less than 2 seconds.     Findings: Rash present.     Comments: Erythematous papular, vesicular rash to bilateral forearms and inner elbows, bilateral lower extremities, trunk.  No drainage from the areas noted, no bleeding noted.  Neurological:     General: No focal deficit present.     Mental Status: She is alert and oriented to person, place, and time.  Psychiatric:        Mood and Affect: Mood normal.        Behavior: Behavior normal.        Thought Content: Thought content normal.      UC Treatments / Results  Labs (all labs ordered are listed, but only abnormal results are displayed) Labs Reviewed - No data to display  EKG   Radiology No results found.  Procedures Procedures (including critical care time)  Medications Ordered in UC Medications  dexamethasone (DECADRON) injection 10 mg (10 mg Intramuscular Given 08/14/20 1407)    Initial Impression / Assessment and Plan / UC Course  I have reviewed the triage vital signs and the nursing notes.  Pertinent labs & imaging results that were available during my care of the patient were reviewed by me and considered in  my medical decision making (see chart for details).    Allergic contact dermatitis  Decadron 10 mg IM given in  office today Steroid taper prescribed Hydroxyzine prescribed to take at night for itching Sedation precautions given Triamcinolone cream prescribed to apply to affected areas twice daily as needed itching Follow up with this office or with primary care if symptoms are persisting.  Follow up in the ER for high fever, trouble swallowing, trouble breathing, other concerning symptoms.   Final Clinical Impressions(s) / UC Diagnoses   Final diagnoses:  Allergic contact dermatitis due to plants, except food     Discharge Instructions     You have received a steroid injection in the office today  I have sent in a prednisone taper for you to take for 6 days. 6 tablets on day one, 5 tablets on day two, 4 tablets on day three, 3 tablets on day four, 2 tablets on day five, and 1 tablet on day six.  I have sent in hydroxyzine for you to take one tablet every 6 hours as needed for itching  I have sent in triamcinolone cream for you to use to the area twice a day as needed.  Follow up with this office or with primary care if symptoms are persisting.  Follow up in the ER for high fever, trouble swallowing, trouble breathing, other concerning symptoms.     ED Prescriptions    Medication Sig Dispense Auth. Provider   predniSONE (STERAPRED UNI-PAK 21 TAB) 10 MG (21) TBPK tablet Take by mouth daily for 6 days. Take 6 tablets on day 1, 5 tablets on day 2, 4 tablets on day 3, 3 tablets on day 4, 2 tablets on day 5, 1 tablet on day 6 21 tablet Faustino Congress, NP   hydrOXYzine (ATARAX/VISTARIL) 25 MG tablet Take 1 tablet (25 mg total) by mouth every 6 (six) hours. 12 tablet Faustino Congress, NP   triamcinolone cream (KENALOG) 0.1 % Apply 1 application topically 2 (two) times daily. 30 g Faustino Congress, NP     PDMP not reviewed this encounter.   Faustino Congress, NP 08/18/20 [phone]

## 2020-08-14 NOTE — Discharge Instructions (Signed)
You have received a steroid injection in the office today  I have sent in a prednisone taper for you to take for 6 days. 6 tablets on day one, 5 tablets on day two, 4 tablets on day three, 3 tablets on day four, 2 tablets on day five, and 1 tablet on day six.  I have sent in hydroxyzine for you to take one tablet every 6 hours as needed for itching  I have sent in triamcinolone cream for you to use to the area twice a day as needed.  Follow up with this office or with primary care if symptoms are persisting.  Follow up in the ER for high fever, trouble swallowing, trouble breathing, other concerning symptoms.

## 2020-11-09 ENCOUNTER — Ambulatory Visit
Admission: EM | Admit: 2020-11-09 | Discharge: 2020-11-09 | Disposition: A | Discharge status: Home / Self Care | Payer: BC Managed Care – PPO | Attending: Emergency Medicine | Admitting: Emergency Medicine

## 2020-11-09 ENCOUNTER — Encounter: Payer: Self-pay | Admitting: Emergency Medicine

## 2020-11-09 DIAGNOSIS — L237 Allergic contact dermatitis due to plants, except food: Secondary | ICD-10-CM

## 2020-11-09 DIAGNOSIS — Z760 Encounter for issue of repeat prescription: Secondary | ICD-10-CM

## 2020-11-09 HISTORY — DX: Essential (primary) hypertension: I10

## 2020-11-09 MED ORDER — HYDROXYZINE HCL 25 MG PO TABS: 25.0000 mg | ORAL_TABLET | Freq: Four times a day (QID) | ORAL | 0 refills | Status: AC

## 2020-11-09 MED ORDER — AMLODIPINE BESYLATE 5 MG PO TABS: 5.0000 mg | ORAL_TABLET | Freq: Every day | ORAL | 0 refills | Status: AC

## 2020-11-09 MED ORDER — TRIAMCINOLONE ACETONIDE 0.1 % EX CREA: 1.0000 "application " | TOPICAL_CREAM | Freq: Two times a day (BID) | CUTANEOUS | 0 refills | Status: DC

## 2020-11-09 MED ORDER — LISINOPRIL 10 MG PO TABS: 10.0000 mg | ORAL_TABLET | Freq: Every day | ORAL | 0 refills | Status: DC

## 2020-11-09 MED ORDER — DEXAMETHASONE SODIUM PHOSPHATE 10 MG/ML IJ SOLN
10.0000 mg | Freq: Once | INTRAMUSCULAR | Status: AC
  Administered 2020-11-09: 10 mg via INTRAMUSCULAR

## 2020-11-09 NOTE — ED Provider Notes (Addendum)
[place]   [id number] 11/09/20 Arrival Time: 1008  CC: Rash  SUBJECTIVE:  Phyllis Swanson is a 60 y.o. female who presents with a rash x 1 day.  Denies precipitating event or trauma.  Denies changes in soaps, detergents, close contacts with similar rash, known trigger or environmental trigger, allergy. Denies medications change or starting a new medication recently.  Speculates she may have had exposure to poison ivy/ oak while at a cookout this past weekend.  Localizes the rash to LT inner elbow, neck and face.  Describes it as red and itch.  Has tried OTC medications without relief.  Symptoms are made worse with itching.  Reports similar symptoms in the past.   Denies fever, chills, nausea, vomiting, erythema, swelling, SOB, chest pain, abdominal pain, changes in bowel or bladder function.    ROS: As per HPI.  All other pertinent ROS negative.     Past Medical History:  Diagnosis Date   Hypertension    Past Surgical History:  Procedure Laterality Date   CESAREAN SECTION  1987   COLONOSCOPY N/A 03/09/2018   Procedure: COLONOSCOPY;  Surgeon: Danie Binder, MD;  Location: [facility];  Service: Endoscopy;  Laterality: N/A;  2:15   PARTIAL HYSTERECTOMY     POLYPECTOMY  03/09/2018   Procedure: POLYPECTOMY;  Surgeon: Danie Binder, MD;  Location: [facility];  Service: Endoscopy;;  transverse polyp   Allergies  Allergen Reactions   Benadryl [Diphenhydramine Hcl (Sleep)] Other (See Comments)    numbeness   No current facility-administered medications on file prior to encounter.   Current Outpatient Medications on File Prior to Encounter  Medication Sig Dispense Refill   acetaminophen (TYLENOL) 325 MG tablet Take 650 mg by mouth every 6 (six) hours as needed (for headaches).     Multiple Vitamin (MULTIVITAMIN WITH MINERALS) TABS tablet Take 1 tablet by mouth every evening.     Polyethyl Glycol-Propyl Glycol (LUBRICANT EYE DROPS) 0.4-0.3 % SOLN Place 1 drop into  both eyes 3 (three) times daily as needed (dry/irritated eyes.).     Social History   Socioeconomic History   Marital status: Single    Spouse name: Not on file   Number of children: Not on file   Years of education: Not on file   Highest education level: Not on file  Occupational History   Not on file  Tobacco Use   Smoking status: Never   Smokeless tobacco: Never  Vaping Use   Vaping Use: Never used  Substance and Sexual Activity   Alcohol use: Not Currently   Drug use: Not Currently   Sexual activity: Yes  Other Topics Concern   Not on file  Social History Narrative   Not on file   Social Determinants of Health   Financial Resource Strain: Not on file  Food Insecurity: Not on file  Transportation Needs: Not on file  Physical Activity: Not on file  Stress: Not on file  Social Connections: Not on file  Intimate Partner Violence: Not on file   Family History  Problem Relation Age of Onset   Colon polyps Neg Hx    Colon cancer Neg Hx     OBJECTIVE: Vitals:   11/09/20 1010 11/09/20 1013  BP:  (!) 155/102  Pulse: 92   Resp: 18   Temp: 98.2 F (36.8 C)   TempSrc: Oral   SpO2: 96%     General appearance: alert; no distress Head: NCAT Lungs: normal respiratory effort Extremities: no  edema Skin: warm and dry; areas of linear papules and vesicles with surrounding erythema to anterior LT elbow, RT side of neck, and RT cheek; NTTP, no obvious drainage or bleeding Psychological: alert and cooperative; normal mood and affect  ASSESSMENT & PLAN:  1. Poison ivy dermatitis   2. Medication refill     Meds ordered this encounter  Medications   hydrOXYzine (ATARAX/VISTARIL) 25 MG tablet    Sig: Take 1 tablet (25 mg total) by mouth every 6 (six) hours.    Dispense:  12 tablet    Refill:  0    Order Specific Question:   Supervising Provider    Answer:   Raylene Everts [id number]   triamcinolone cream (KENALOG) 0.1 %    Sig: Apply 1 application topically 2  (two) times daily.    Dispense:  30 g    Refill:  0    Order Specific Question:   Supervising Provider    Answer:   Raylene Everts [id number]   dexamethasone (DECADRON) injection 10 mg   lisinopril (ZESTRIL) 10 MG tablet    Sig: Take 1 tablet (10 mg total) by mouth daily.    Dispense:  14 tablet    Refill:  0    Order Specific Question:   Supervising Provider    Answer:   Raylene Everts [id number]   Wash with warm water and mild soap Steroid shot given in office Triamcinolone cream Use OTC zyrtec, allegra, or claritin during the day.  Benadryl at night. You may also use OTC hydrocortisone cream and/or calamine lotion to help alleviate itching Hydroxyzine prescribed.  Take as directed for itching.  DO NOT TAKE PRIOR TO DRIVING OR OPERATING MACHINERY Follow up with PCP if symptoms persists  Return or go to the ED if you have any new or worsening symptoms such as fever, chills, nausea, vomiting, difficulty breathing, throat swelling, tongue swelling, numbness/ tingling in mouth, worsening symptoms despite treatment, etc...   Blood pressure medications refilled Will follow up with PCP  Reviewed expectations re: course of current medical issues. Questions answered. Outlined signs and symptoms indicating need for more acute intervention. Patient verbalized understanding. After Visit Summary given.    Lestine Box, PA-C 11/09/20 [address], [place], [place] 11/09/20 [phone]

## 2020-11-09 NOTE — ED Triage Notes (Addendum)
Itchy rash to face neck and arms that started yesterday.  Pt is also out of her lisinopril 10 mg daily but has an appt next week with pcp, would like enough to make it to appointment.

## 2020-11-09 NOTE — Discharge Instructions (Addendum)
Wash with warm water and mild soap Steroid shot given in office Triamcinolone cream Use OTC zyrtec, allegra, or claritin during the day.  Benadryl at night. You may also use OTC hydrocortisone cream and/or calamine lotion to help alleviate itching Hydroxyzine prescribed.  Take as directed for itching.  DO NOT TAKE PRIOR TO DRIVING OR OPERATING MACHINERY Follow up with PCP if symptoms persists  Return or go to the ED if you have any new or worsening symptoms such as fever, chills, nausea, vomiting, difficulty breathing, throat swelling, tongue swelling, numbness/ tingling in mouth, worsening symptoms despite treatment, etc..Marland Kitchen

## 2023-01-22 ENCOUNTER — Encounter: Payer: Self-pay | Admitting: Emergency Medicine

## 2023-01-22 ENCOUNTER — Other Ambulatory Visit: Payer: Self-pay

## 2023-01-22 ENCOUNTER — Ambulatory Visit
Admission: EM | Admit: 2023-01-22 | Discharge: 2023-01-22 | Disposition: A | Discharge status: Home / Self Care | Payer: BC Managed Care – PPO | Attending: Nurse Practitioner | Admitting: Nurse Practitioner

## 2023-01-22 DIAGNOSIS — R21 Rash and other nonspecific skin eruption: Secondary | ICD-10-CM | POA: Diagnosis not present

## 2023-01-22 MED ORDER — TRIAMCINOLONE ACETONIDE 0.1 % EX CREA: 1.0000 | TOPICAL_CREAM | Freq: Two times a day (BID) | CUTANEOUS | 0 refills | Status: AC

## 2023-01-22 MED ORDER — VALACYCLOVIR HCL 1 G PO TABS: 1000.0000 mg | ORAL_TABLET | Freq: Three times a day (TID) | ORAL | 0 refills | Status: AC

## 2023-01-22 MED ORDER — PREDNISONE 20 MG PO TABS: 40.0000 mg | ORAL_TABLET | Freq: Every day | ORAL | 0 refills | Status: AC

## 2023-01-22 NOTE — ED Triage Notes (Signed)
Pt reports rash to left side of abdomen x2 weeks. Has used otc cream with no change in symptoms. Denies any new self-care products.

## 2023-01-22 NOTE — ED Provider Notes (Signed)
[facility]    CSN: [id number] Arrival date & time: 01/22/23  1113      History   Chief Complaint Chief Complaint  Patient presents with   Rash    HPI Phyllis Swanson is a 62 y.o. female.   The history is provided by the patient.   Patient presents for complaints of rash to the right side of her chest/back that started over the past 2 days.  Patient states that the rash is itchy.  She states that she noticed that the area was "burning" when initially started.  She states that the itching is more noticeable when she puts on her pajamas at bedtime or after she gets out of the shower.  She denies fever, chills, exposure to new soaps, medications, lotions, detergents, foods, or body washes.  Patient reports she has been using triamcinolone cream to the affected area.  Patient denies prior history of chickenpox.  Patient reports that she has been under an increased amount of stress over the past several weeks due to the loss of her brother, running for the school board, and her mother being involved in a motor vehicle collision.  Past Medical History:  Diagnosis Date   Hypertension     Patient Active Problem List   Diagnosis Date Noted   Special screening for malignant neoplasms, colon     Past Surgical History:  Procedure Laterality Date   CESAREAN SECTION  1987   COLONOSCOPY N/A 03/09/2018   Procedure: COLONOSCOPY;  Surgeon: West Bali, MD;  Location: [facility];  Service: Endoscopy;  Laterality: N/A;  2:15   PARTIAL HYSTERECTOMY     POLYPECTOMY  03/09/2018   Procedure: POLYPECTOMY;  Surgeon: West Bali, MD;  Location: [facility];  Service: Endoscopy;;  transverse polyp    OB History     Gravida  1   Para      Term      Preterm      AB      Living  1      SAB      IAB      Ectopic      Multiple      Live Births  1            Home Medications    Prior to Admission medications   Medication Sig Start Date End Date  Taking? Authorizing Provider  predniSONE (DELTASONE) 20 MG tablet Take 2 tablets (40 mg total) by mouth daily with breakfast for 5 days. 01/22/23 01/27/23 Yes Leath-Warren, Sadie Haber, NP  triamcinolone cream (KENALOG) 0.1 % Apply 1 Application topically 2 (two) times daily. 01/22/23  Yes Leath-Warren, Sadie Haber, NP  valACYclovir (VALTREX) 1000 MG tablet Take 1 tablet (1,000 mg total) by mouth 3 (three) times daily. 01/22/23  Yes Leath-Warren, Sadie Haber, NP  acetaminophen (TYLENOL) 325 MG tablet Take 650 mg by mouth every 6 (six) hours as needed (for headaches).    [provider]  amLODipine (NORVASC) 5 MG tablet Take 1 tablet (5 mg total) by mouth daily. 11/09/20   Wurst, [place], PA-C  hydrOXYzine (ATARAX/VISTARIL) 25 MG tablet Take 1 tablet (25 mg total) by mouth every 6 (six) hours. 11/09/20   Wurst, [place], PA-C  Multiple Vitamin (MULTIVITAMIN WITH MINERALS) TABS tablet Take 1 tablet by mouth every evening.    [provider]  Polyethyl Glycol-Propyl Glycol (LUBRICANT EYE DROPS) 0.4-0.3 % SOLN Place 1 drop into both eyes 3 (three) times daily as needed (  dry/irritated eyes.).    [provider]    Family History Family History  Problem Relation Age of Onset   Colon polyps Neg Hx    Colon cancer Neg Hx     Social History Social History   Tobacco Use   Smoking status: Never   Smokeless tobacco: Never  Vaping Use   Vaping status: Never Used  Substance Use Topics   Alcohol use: Not Currently   Drug use: Not Currently     Allergies   Ace inhibitors and Benadryl [diphenhydramine hcl (sleep)]   Review of Systems Review of Systems Per HPI  Physical Exam Triage Vital Signs ED Triage Vitals [01/22/23 1206]  Encounter Vitals Group     BP 122/87     Systolic BP Percentile      Diastolic BP Percentile      Pulse Rate 77     Resp 20     Temp 98.4 F (36.9 C)     Temp Source Oral     SpO2 96 %     Weight      Height      Head Circumference       Peak Flow      Pain Score      Pain Loc      Pain Education      Exclude from Growth Chart    No data found.  Updated Vital Signs BP 122/87 (BP Location: Right Arm)   Pulse 77   Temp 98.4 F (36.9 C) (Oral)   Resp 20   SpO2 96%   Visual Acuity Right Eye Distance:   Left Eye Distance:   Bilateral Distance:    Right Eye Near:   Left Eye Near:    Bilateral Near:     Physical Exam Vitals and nursing note reviewed.  Constitutional:      General: She is not in acute distress.    Appearance: Normal appearance.  HENT:     Head: Normocephalic.  Eyes:     Extraocular Movements: Extraocular movements intact.     Pupils: Pupils are equal, round, and reactive to light.  Pulmonary:     Effort: Pulmonary effort is normal.  Musculoskeletal:     Cervical back: Normal range of motion.  Lymphadenopathy:     Cervical: No cervical adenopathy.  Skin:    General: Skin is warm and dry.     Findings: Rash present. Rash is vesicular.     Comments: Erythematous vesicular lesions noted to the patient's left chest and trunk.  Lesions are in no congruent pattern, localized to the left chest and trunk.  There is no oozing, fluctuance, or drainage present.  Neurological:     General: No focal deficit present.     Mental Status: She is alert and oriented to person, place, and time.  Psychiatric:        Mood and Affect: Mood normal.        Behavior: Behavior normal.      UC Treatments / Results  Labs (all labs ordered are listed, but only abnormal results are displayed) Labs Reviewed - No data to display  EKG   Radiology No results found.  Procedures Procedures (including critical care time)  Medications Ordered in UC Medications - No data to display  Initial Impression / Assessment and Plan / UC Course  I have reviewed the triage vital signs and the nursing notes.  Pertinent labs & imaging results that were available during my care of the  patient were reviewed by me and  considered in my medical decision making (see chart for details).  Suspect shingles based on patient's current symptoms and presentation of rash.  Will treat with valacyclovir 1000 mg 3 times daily, Prednisone 40mg  x 5 days for inflammation and itching and triamcinolone cream for itching. Supportive care recommendations were provided and discussed with the patient to include over the counter analgesics, cool compresses, and to cover the areas if they begin to ooze. Patient advised to consider shingles vaccination once symptoms improve. Patient is in agreement with this plan of care and verbalizes understanding, all questions were answered. Patient is stable for discharge.  Final Clinical Impressions(s) / UC Diagnoses   Final diagnoses:  Rash and nonspecific skin eruption     Discharge Instructions      Your symptoms appear to be consistent with shingles. Take medication as prescribed. May take over-the-counter Tylenol as needed for pain, fever, general discomfort. Increase fluids and allow for plenty of rest. May cover the areas on your chest with cool cloths to help with itching, pain, or discomfort. Do not scratch or irritate the areas while symptoms persist. If the areas begin to ooze or blister, keep them covered. As discussed, after the rash resolves, if you continue to experience a "nerve pain" in the area, please follow-up with your primary care physician for further evaluation. Please follow-up with your PCP to discuss administration of the shingles vaccine. Follow-up as needed.      ED Prescriptions     Medication Sig Dispense Auth. Provider   valACYclovir (VALTREX) 1000 MG tablet Take 1 tablet (1,000 mg total) by mouth 3 (three) times daily. 21 tablet Leath-Warren, Sadie Haber, NP   predniSONE (DELTASONE) 20 MG tablet Take 2 tablets (40 mg total) by mouth daily with breakfast for 5 days. 10 tablet Leath-Warren, Sadie Haber, NP   triamcinolone cream (KENALOG) 0.1 % Apply 1  Application topically 2 (two) times daily. 80 g Leath-Warren, Sadie Haber, NP      PDMP not reviewed this encounter.   Abran Cantor, NP 01/22/23 1315

## 2023-01-22 NOTE — Discharge Instructions (Signed)
Your symptoms appear to be consistent with shingles. Take medication as prescribed. May take over-the-counter Tylenol as needed for pain, fever, general discomfort. Increase fluids and allow for plenty of rest. May cover the areas on your chest with cool cloths to help with itching, pain, or discomfort. Do not scratch or irritate the areas while symptoms persist. If the areas begin to ooze or blister, keep them covered. As discussed, after the rash resolves, if you continue to experience a "nerve pain" in the area, please follow-up with your primary care physician for further evaluation. Please follow-up with your PCP to discuss administration of the shingles vaccine. Follow-up as needed.

## 2023-02-12 ENCOUNTER — Encounter: Payer: Self-pay | Admitting: *Deleted
